# Patient Record
Sex: Male | Born: 2000 | Race: White | Hispanic: No | Marital: Single | Smoking: Never smoker
Health system: Southern US, Community
[De-identification: ages and names within clinical notes are randomized; demographics above are authoritative.]

## PROBLEM LIST (undated history)

## (undated) DIAGNOSIS — J45909 Unspecified asthma, uncomplicated: Secondary | ICD-10-CM

## (undated) DIAGNOSIS — F909 Attention-deficit hyperactivity disorder, unspecified type: Secondary | ICD-10-CM

## (undated) DIAGNOSIS — K2210 Ulcer of esophagus without bleeding: Secondary | ICD-10-CM

## (undated) HISTORY — PX: TOOTH EXTRACTION: SUR596

---
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
## 2022-03-09 IMAGING — DX DG HAND COMPLETE 3+V*R*
3 series · 3 of 3 positions shown · non-contrast
Comparison: None.

CLINICAL DATA: Pain

EXAM:
RIGHT HAND - COMPLETE 3+ VIEW

[hand pa]
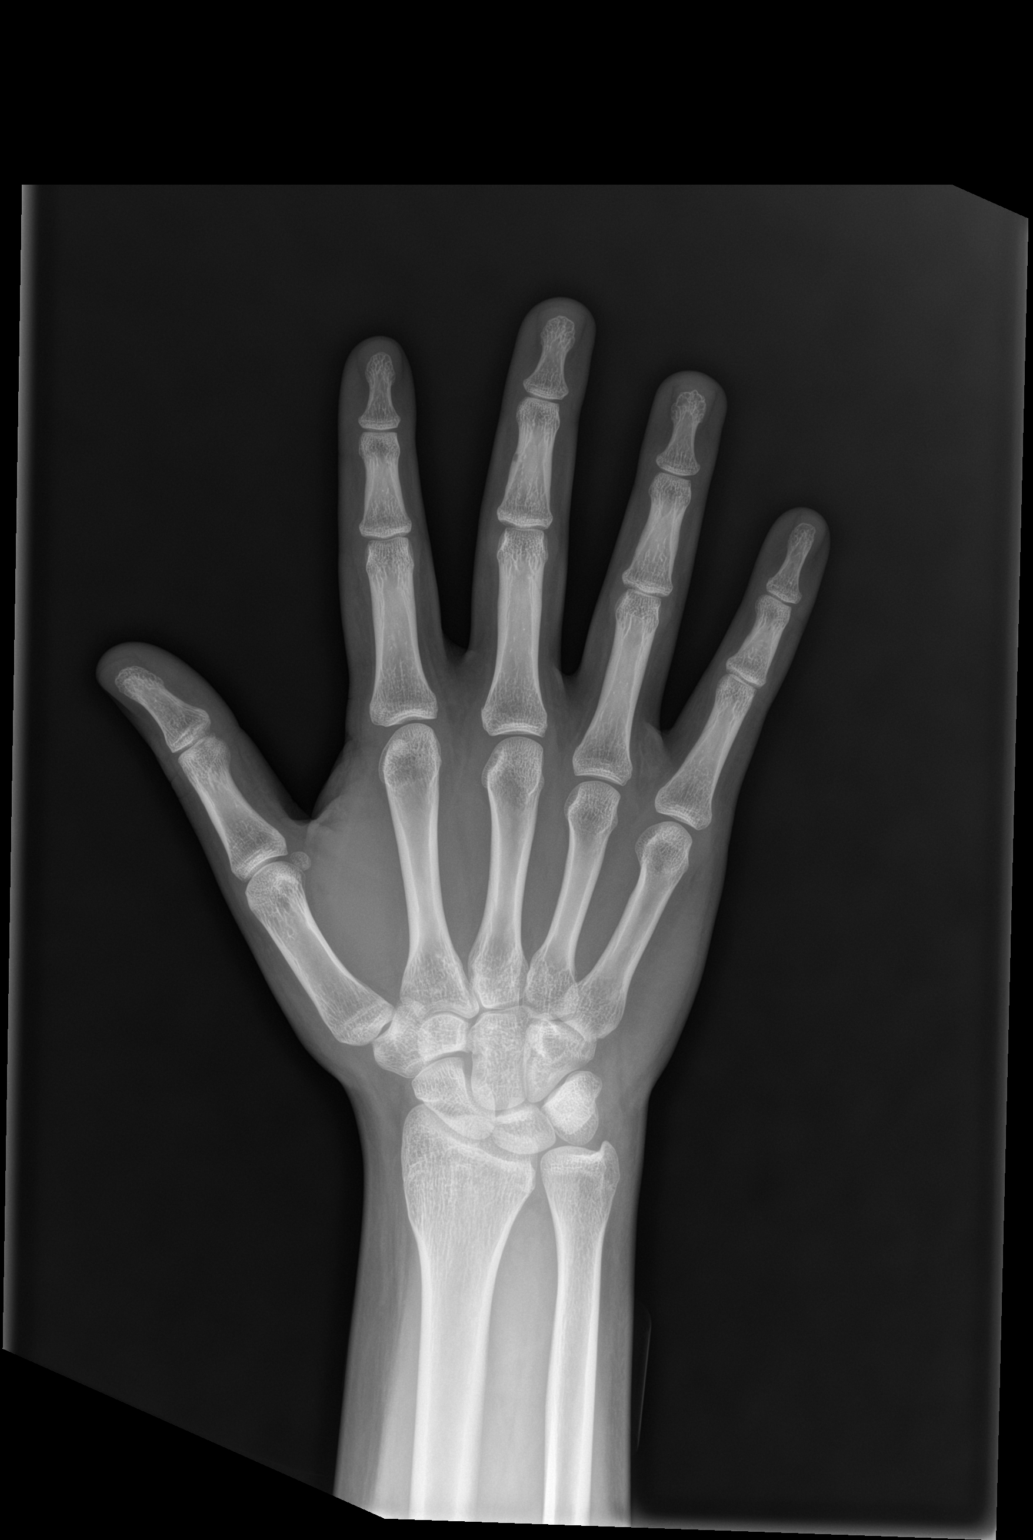

[hand obl]
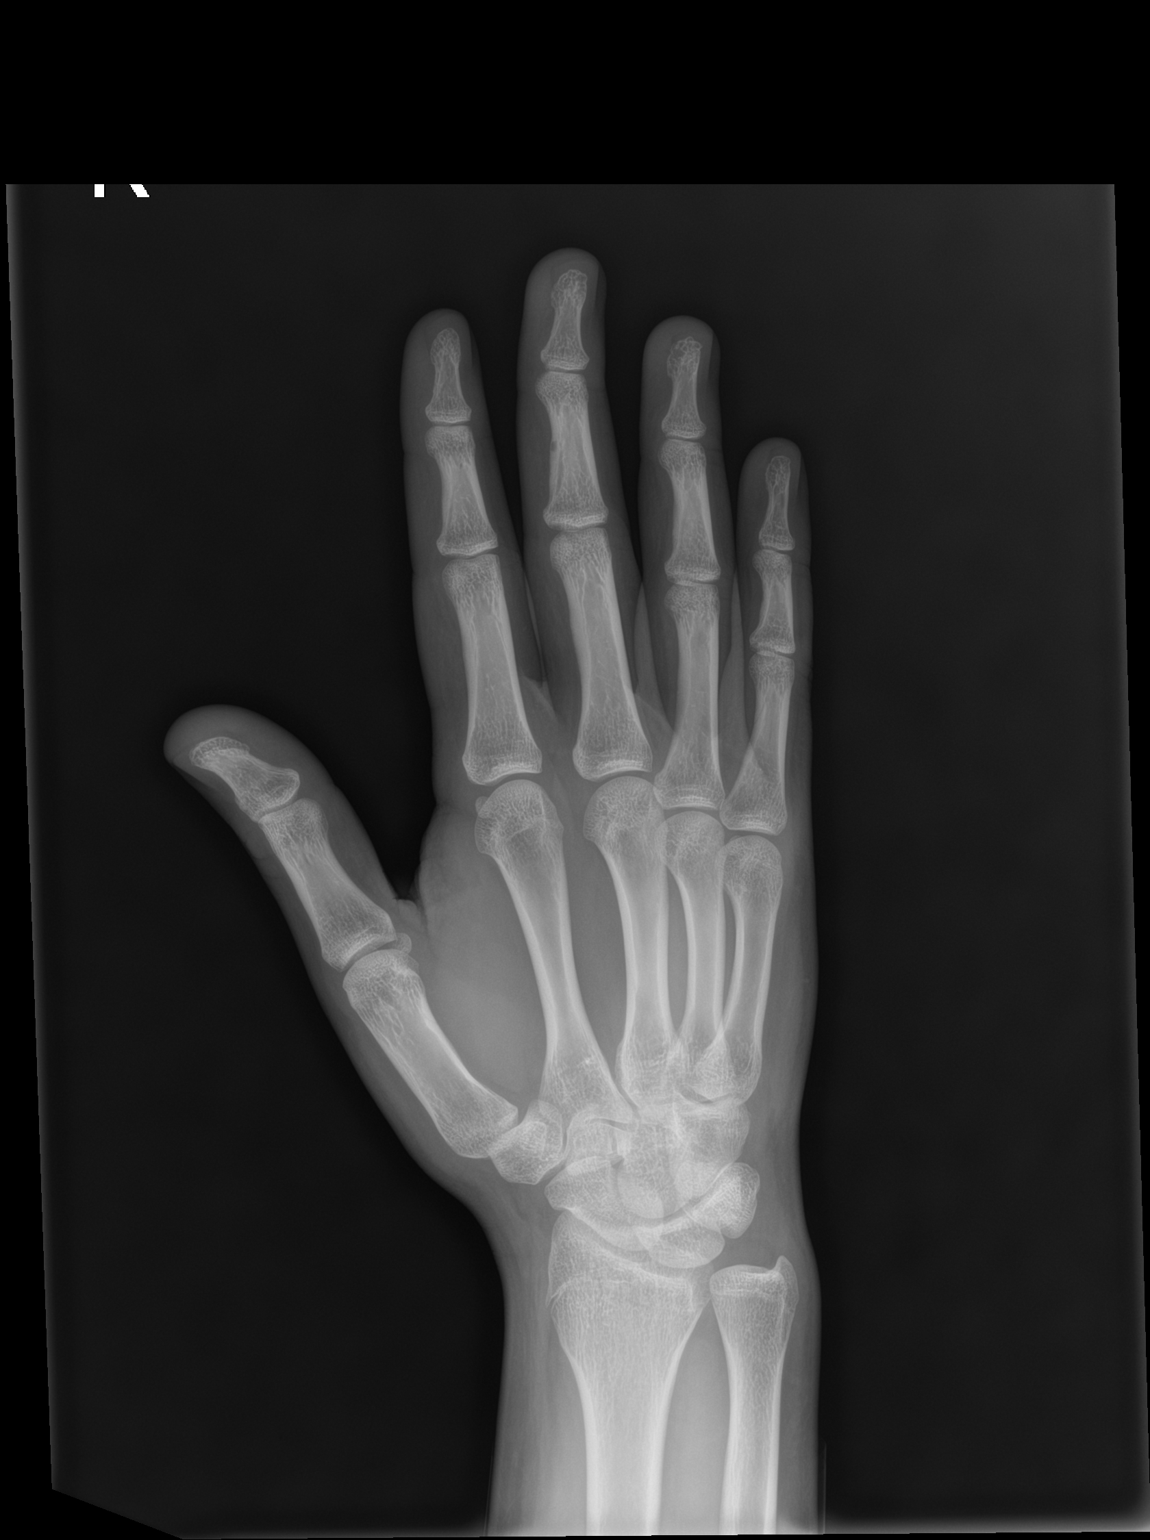

[hand lat]
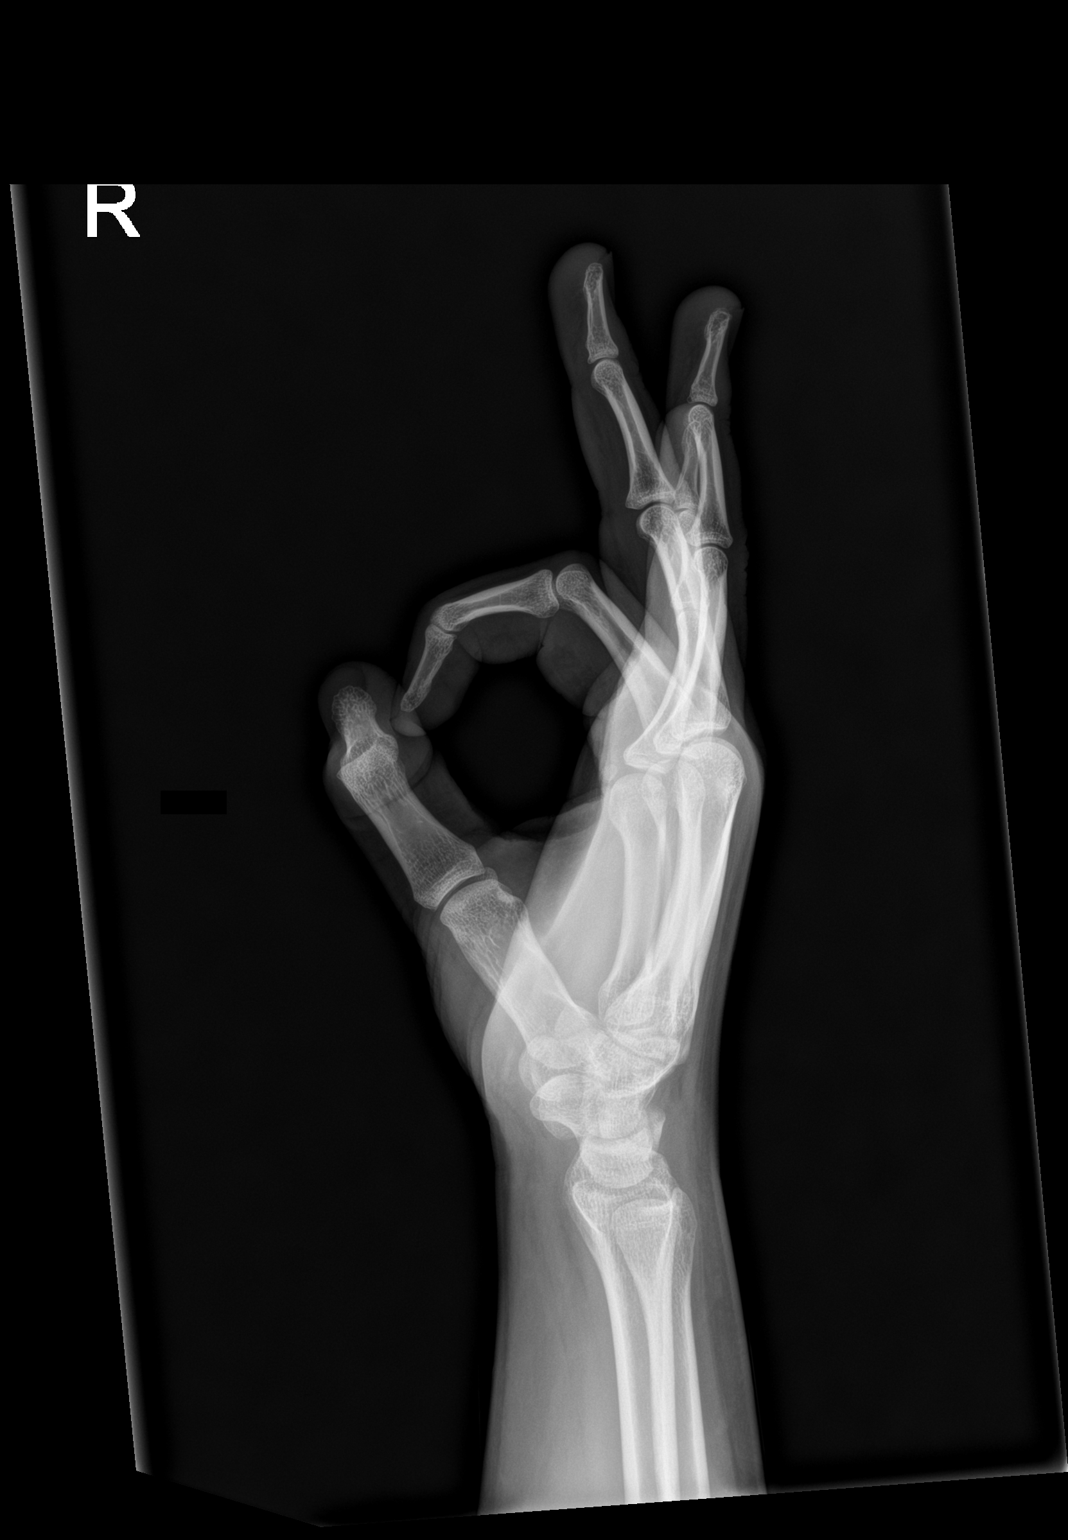

[3 of 3 positions shown; findings below may reference images not displayed]

FINDINGS: No evidence of fracture, dislocation, or joint effusion. No evidence
of severe arthropathy. No aggressive appearing focal bone
abnormality. Soft tissues are unremarkable.
IMPRESSION: Negative.
